# Patient Record
Sex: Female | Born: 1960 | Race: Black or African American | Hispanic: No | Marital: Single | Smoking: Current every day smoker
Health system: Southern US, Community
[De-identification: ages and names within clinical notes are randomized; demographics above are authoritative.]

## PROBLEM LIST (undated history)

## (undated) DIAGNOSIS — I10 Essential (primary) hypertension: Secondary | ICD-10-CM

## (undated) DIAGNOSIS — E119 Type 2 diabetes mellitus without complications: Secondary | ICD-10-CM

## (undated) DIAGNOSIS — E7800 Pure hypercholesterolemia, unspecified: Secondary | ICD-10-CM

---

## 2010-10-13 ENCOUNTER — Other Ambulatory Visit (HOSPITAL_COMMUNITY): Payer: Self-pay | Admitting: Family Medicine

## 2010-10-13 DIAGNOSIS — R102 Pelvic and perineal pain: Secondary | ICD-10-CM

## 2010-10-16 ENCOUNTER — Ambulatory Visit (HOSPITAL_COMMUNITY)
Admission: RE | Admit: 2010-10-16 | Discharge: 2010-10-16 | Disposition: A | Discharge status: Home / Self Care | Payer: Self-pay | Source: Ambulatory Visit | Attending: Family Medicine | Admitting: Family Medicine

## 2010-10-16 ENCOUNTER — Other Ambulatory Visit (HOSPITAL_COMMUNITY): Payer: Self-pay

## 2010-10-16 DIAGNOSIS — N83209 Unspecified ovarian cyst, unspecified side: Secondary | ICD-10-CM | POA: Insufficient documentation

## 2010-10-16 DIAGNOSIS — N949 Unspecified condition associated with female genital organs and menstrual cycle: Secondary | ICD-10-CM | POA: Insufficient documentation

## 2010-10-16 DIAGNOSIS — D259 Leiomyoma of uterus, unspecified: Secondary | ICD-10-CM | POA: Insufficient documentation

## 2010-10-16 DIAGNOSIS — R102 Pelvic and perineal pain: Secondary | ICD-10-CM

## 2014-02-19 ENCOUNTER — Telehealth: Payer: Self-pay

## 2014-02-19 NOTE — Telephone Encounter (Signed)
Tried to call pt to update meds, and Vm not set up.

## 2014-03-10 NOTE — Telephone Encounter (Signed)
Error Wrong chart

## 2020-10-17 ENCOUNTER — Encounter (HOSPITAL_COMMUNITY): Payer: Self-pay

## 2020-10-17 ENCOUNTER — Emergency Department (HOSPITAL_COMMUNITY): Payer: Self-pay

## 2020-10-17 ENCOUNTER — Emergency Department (HOSPITAL_COMMUNITY)
Admission: EM | Admit: 2020-10-17 | Discharge: 2020-10-17 | Disposition: A | Discharge status: Home / Self Care | Payer: Self-pay | Attending: Emergency Medicine | Admitting: Emergency Medicine

## 2020-10-17 ENCOUNTER — Other Ambulatory Visit: Payer: Self-pay

## 2020-10-17 DIAGNOSIS — Z8616 Personal history of COVID-19: Secondary | ICD-10-CM | POA: Insufficient documentation

## 2020-10-17 DIAGNOSIS — I10 Essential (primary) hypertension: Secondary | ICD-10-CM | POA: Insufficient documentation

## 2020-10-17 DIAGNOSIS — E119 Type 2 diabetes mellitus without complications: Secondary | ICD-10-CM | POA: Insufficient documentation

## 2020-10-17 DIAGNOSIS — J029 Acute pharyngitis, unspecified: Secondary | ICD-10-CM | POA: Insufficient documentation

## 2020-10-17 DIAGNOSIS — R1013 Epigastric pain: Secondary | ICD-10-CM | POA: Insufficient documentation

## 2020-10-17 DIAGNOSIS — F17210 Nicotine dependence, cigarettes, uncomplicated: Secondary | ICD-10-CM | POA: Insufficient documentation

## 2020-10-17 DIAGNOSIS — R110 Nausea: Secondary | ICD-10-CM | POA: Insufficient documentation

## 2020-10-17 HISTORY — DX: Pure hypercholesterolemia, unspecified: E78.00

## 2020-10-17 HISTORY — DX: Type 2 diabetes mellitus without complications: E11.9

## 2020-10-17 HISTORY — DX: Essential (primary) hypertension: I10

## 2020-10-17 LAB — COMPREHENSIVE METABOLIC PANEL
ALT: 15 U/L (ref 0–44)
AST: 21 U/L (ref 15–41)
Albumin: 4.5 g/dL (ref 3.5–5.0)
Alkaline Phosphatase: 59 U/L (ref 38–126)
Anion gap: 8 (ref 5–15)
BUN: 19 mg/dL (ref 6–20)
CO2: 25 mmol/L (ref 22–32)
Calcium: 9.7 mg/dL (ref 8.9–10.3)
Chloride: 104 mmol/L (ref 98–111)
Creatinine, Ser: 0.81 mg/dL (ref 0.44–1.00)
GFR, Estimated: >60 mL/min (ref >60)
Glucose, Bld: 101 mg/dL — ABNORMAL HIGH (ref 70–99)
Potassium: 4 mmol/L (ref 3.5–5.1)
Sodium: 137 mmol/L (ref 135–145)
Total Bilirubin: 0.6 mg/dL (ref 0.3–1.2)
Total Protein: 7.5 g/dL (ref 6.5–8.1)

## 2020-10-17 LAB — CBC
HCT: 46.8 % — ABNORMAL HIGH (ref 36.0–46.0)
Hemoglobin: 15.2 g/dL — ABNORMAL HIGH (ref 12.0–15.0)
MCH: 32.7 pg (ref 26.0–34.0)
MCHC: 32.5 g/dL (ref 30.0–36.0)
MCV: 100.6 fL — ABNORMAL HIGH (ref 80.0–100.0)
Platelets: 161 10*3/uL (ref 150–400)
RBC: 4.65 MIL/uL (ref 3.87–5.11)
RDW: 13.2 % (ref 11.5–15.5)
WBC: 6.2 10*3/uL (ref 4.0–10.5)
nRBC: 0 % (ref 0.0–0.2)

## 2020-10-17 LAB — URINALYSIS, ROUTINE W REFLEX MICROSCOPIC
Bacteria, UA: NONE SEEN
Bilirubin Urine: NEGATIVE
Glucose, UA: NEGATIVE mg/dL
Ketones, ur: 20 mg/dL — AB
Leukocytes,Ua: NEGATIVE
Nitrite: NEGATIVE
Protein, ur: NEGATIVE mg/dL
Specific Gravity, Urine: 1.012 (ref 1.005–1.030)
pH: 5 (ref 5.0–8.0)

## 2020-10-17 LAB — LIPASE, BLOOD: Lipase: 23 U/L (ref 11–51)

## 2020-10-17 LAB — TROPONIN I (HIGH SENSITIVITY)
Troponin I (High Sensitivity): 33 ng/L — ABNORMAL HIGH (ref <18)
Troponin I (High Sensitivity): 30 ng/L — ABNORMAL HIGH (ref <18)

## 2020-10-17 MED ORDER — ONDANSETRON HCL 4 MG/2ML IJ SOLN
4.0000 mg | Freq: Once | INTRAMUSCULAR | Status: DC
  Filled 2020-10-17: qty 2

## 2020-10-17 MED ORDER — OMEPRAZOLE 20 MG PO CPDR: 20.0000 mg | DELAYED_RELEASE_CAPSULE | Freq: Every day | ORAL | 1 refills | Status: AC

## 2020-10-17 MED ORDER — OMEPRAZOLE 20 MG PO CPDR: 20.0000 mg | DELAYED_RELEASE_CAPSULE | Freq: Every day | ORAL | 1 refills | Status: DC

## 2020-10-17 NOTE — ED Notes (Signed)
Advised pt I needed urine

## 2020-10-17 NOTE — ED Triage Notes (Signed)
Pt presents to ED with complaints of epigastric pain started last night but worse overnight, with nausea.

## 2020-10-17 NOTE — ED Provider Notes (Signed)
[facility] EMERGENCY DEPARTMENT Provider Note   CSN: [id number] Arrival date & time: 10/17/20  0756     History Chief Complaint  Patient presents with  . Abdominal Pain    Catherine Lindsey is a 60 y.o. female.  Patient with onset of epigastric abdominal pain last night got worse overnight.  Associated with nausea but no vomiting.  Is not on any medications.  Past medical history sniffing for hypertension high cholesterol diabetes without complications.  Symptoms also associated with sore throat.  Patient had COVID in February.  Does not remind her that.        Past Medical History:  Diagnosis Date  . Diabetes mellitus without complication ([place])   . Hypercholesteremia   . Hypertension     There are no problems to display for this patient.   Past Surgical History:  Procedure Laterality Date  . CESAREAN SECTION       OB History   No obstetric history on file.     No family history on file.  Social History   Tobacco Use  . Smoking status: Current Every Day Smoker    Packs/day: 0.50    Types: Cigarettes  . Smokeless tobacco: Never Used  Substance Use Topics  . Alcohol use: Yes    Comment: 1-2 beer/day  . Drug use: Not Currently    Home Medications Prior to Admission medications   Not on File    Allergies    Patient has no known allergies.  Review of Systems   Review of Systems  Constitutional: Negative for chills and fever.  HENT: Negative for rhinorrhea and sore throat.   Eyes: Negative for visual disturbance.  Respiratory: Negative for cough and shortness of breath.   Cardiovascular: Negative for chest pain and leg swelling.  Gastrointestinal: Positive for abdominal pain. Negative for diarrhea, nausea and vomiting.  Genitourinary: Negative for dysuria.  Musculoskeletal: Negative for back pain and neck pain.  Skin: Negative for rash.  Neurological: Negative for dizziness, light-headedness and headaches.  Hematological: Does not bruise/bleed  easily.  Psychiatric/Behavioral: Negative for confusion.    Physical Exam Updated Vital Signs BP (!) 156/88   Pulse 82   Temp 98 F (36.7 C) (Oral)   Resp 20   Ht 1.676 m (5\' 6" )   Wt 60.3 kg   SpO2 100%   BMI 21.47 kg/m   Physical Exam Vitals and nursing note reviewed.  Constitutional:      General: She is not in acute distress.    Appearance: Normal appearance. She is well-developed. She is not ill-appearing.  HENT:     Head: Normocephalic and atraumatic.     Mouth/Throat:     Pharynx: Oropharynx is clear.  Eyes:     Extraocular Movements: Extraocular movements intact.     Conjunctiva/sclera: Conjunctivae normal.     Pupils: Pupils are equal, round, and reactive to light.  Cardiovascular:     Rate and Rhythm: Normal rate and regular rhythm.     Heart sounds: No murmur heard.   Pulmonary:     Effort: Pulmonary effort is normal. No respiratory distress.     Breath sounds: Normal breath sounds. No wheezing.  Abdominal:     Palpations: Abdomen is soft.     Tenderness: There is no abdominal tenderness.  Musculoskeletal:        General: No swelling. Normal range of motion.     Cervical back: Normal range of motion and neck supple.  Skin:    General: Skin is  warm and dry.  Neurological:     General: No focal deficit present.     Mental Status: She is alert and oriented to person, place, and time.     Cranial Nerves: No cranial nerve deficit.     Sensory: No sensory deficit.     Motor: No weakness.     ED Results / Procedures / Treatments   Labs (all labs ordered are listed, but only abnormal results are displayed) Labs Reviewed  LIPASE, BLOOD  COMPREHENSIVE METABOLIC PANEL  CBC  URINALYSIS, ROUTINE W REFLEX MICROSCOPIC    EKG EKG Interpretation  Date/Time:  Monday Oct 17 2020 08:06:33 EDT Ventricular Rate:  85 PR Interval:  160 QRS Duration: 98 QT Interval:  397 QTC Calculation: 473 R Axis:   74 Text Interpretation: Sinus rhythm LAE, consider  biatrial enlargement LVH with secondary repolarization abnormality No previous ECGs available Confirmed by Fredia Sorrow [phone]) on 10/17/2020 8:14:23 AM   Radiology No results found.  Procedures Procedures   Medications Ordered in ED Medications - No data to display  ED Course  I have reviewed the triage vital signs and the nursing notes.  Pertinent labs & imaging results that were available during my care of the patient were reviewed by me and considered in my medical decision making (see chart for details).    MDM Rules/Calculators/A&P                          Work-up here ultrasound without any acute findings.  Initial troponin was 30 3 repeat was 30.  Urinalysis negative for urinary tract infection.  Liver function test without any acute findings.  No leukocytosis.  Hemoglobin 15 somewhat may be hemoconcentrated.  Chest x-ray without any acute findings EKG without any acute findings.   Clinically we will treat as if it could be reflux.  But not exactly sure how to explain the sore throat.  Less it was reflux up into the throat area.  Give patient referral to GI medicine treat with Prilosec.  Patient will return for any new or worse symptoms.  No evidence of any acute cardiac event.  No evidence of any acute pulmonary event.  Patient states still having some epigastric pain is intermittent in nature.  Final Clinical Impression(s) / ED Diagnoses Final diagnoses:  None    Rx / DC Orders ED Discharge Orders    None       Fredia Sorrow, MD 10/17/20 1404

## 2020-10-17 NOTE — Discharge Instructions (Addendum)
Work-up here today without any acute findings.  Make an appointment to follow-up with GI medicine information provided above.  Take the Prilosec as directed.  Return for any new or worse symptoms.

## 2020-10-17 NOTE — ED Notes (Signed)
Put the patient on a 12 lead monitor

## 2022-10-17 IMAGING — US US ABDOMEN COMPLETE
3 of 4 series · 13 of 25 positions shown · non-contrast
Comparison: None.

CLINICAL DATA: Epigastric pain for 1 day

EXAM:
ABDOMEN ULTRASOUND COMPLETE

[Series 1: us abdomen complete · 6 of 114 slices shown (1 of 2)]
[im 1/114]
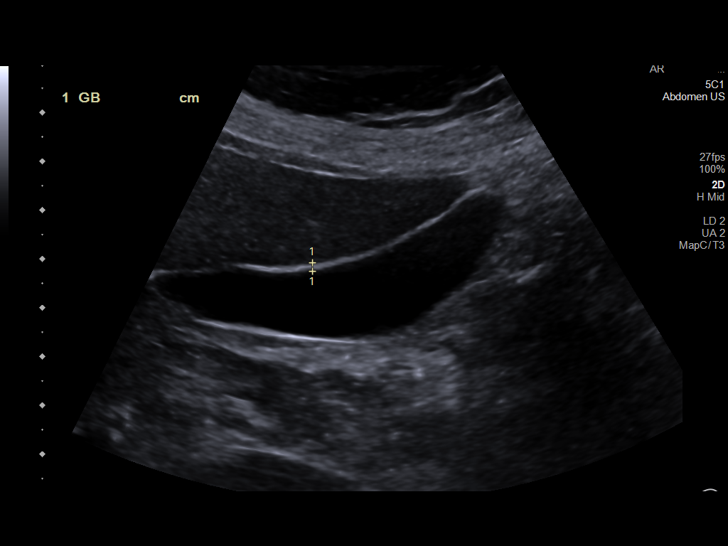
[im 23/114]
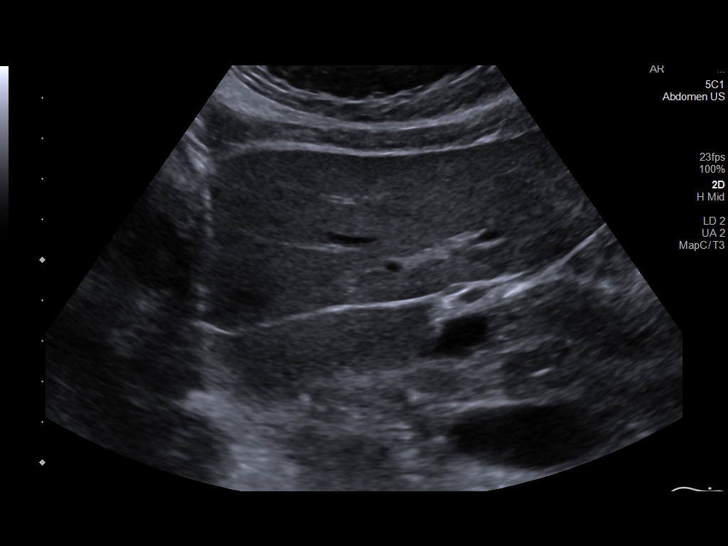
[im 46/114]
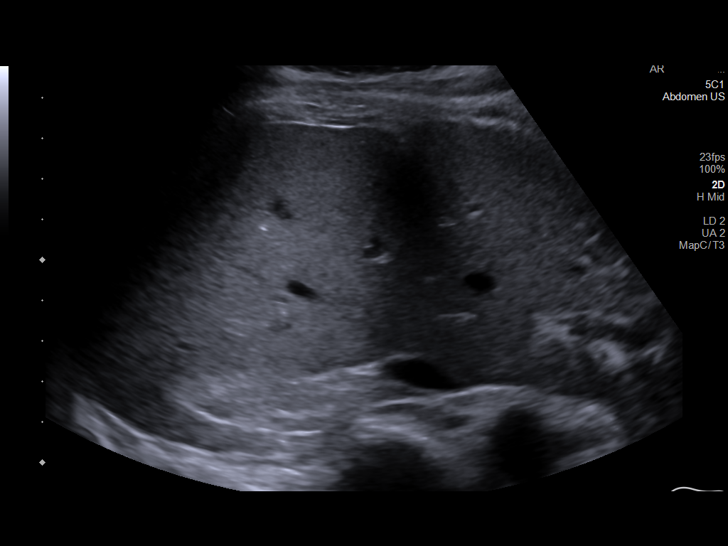
[im 68/114]
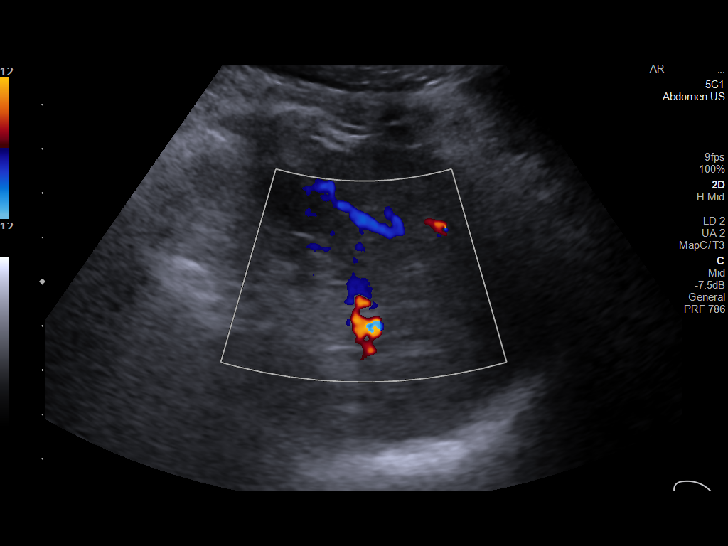
[im 91/114]
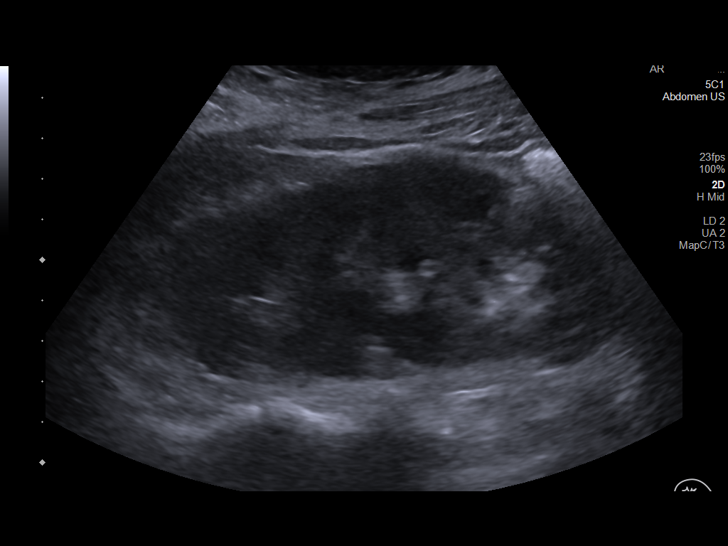
[im 114/114]
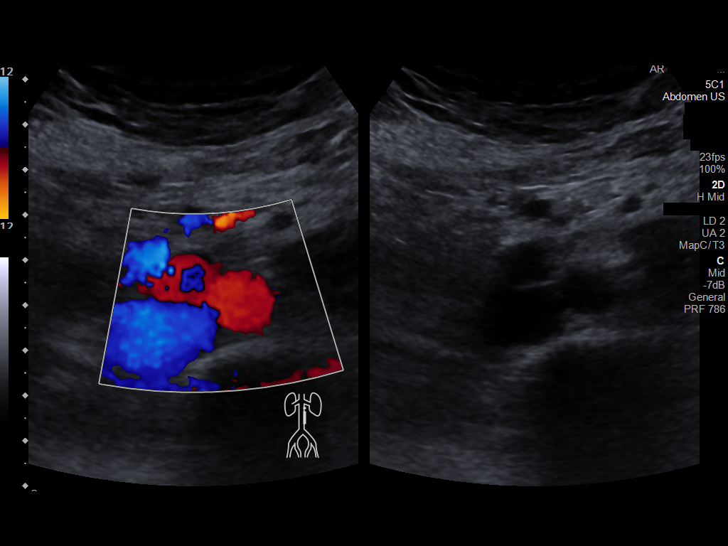

[Series 1: us abdomen complete · 6 of 117 slices shown (2 of 2)]
[im 11/117]
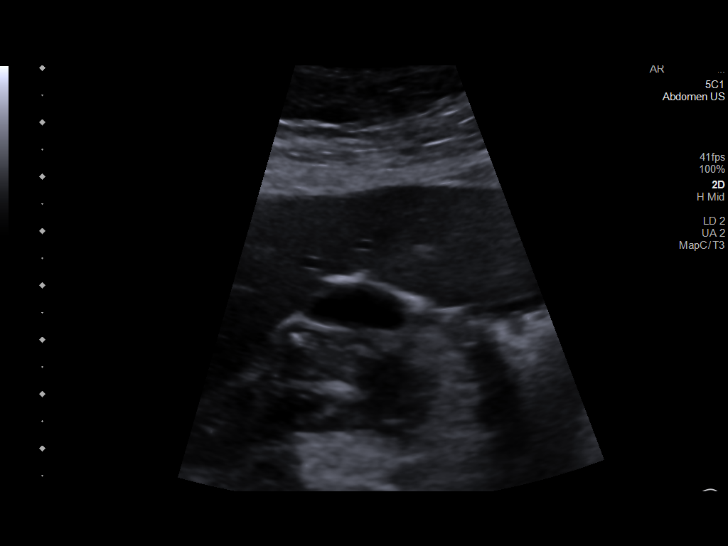
[im 32/117]
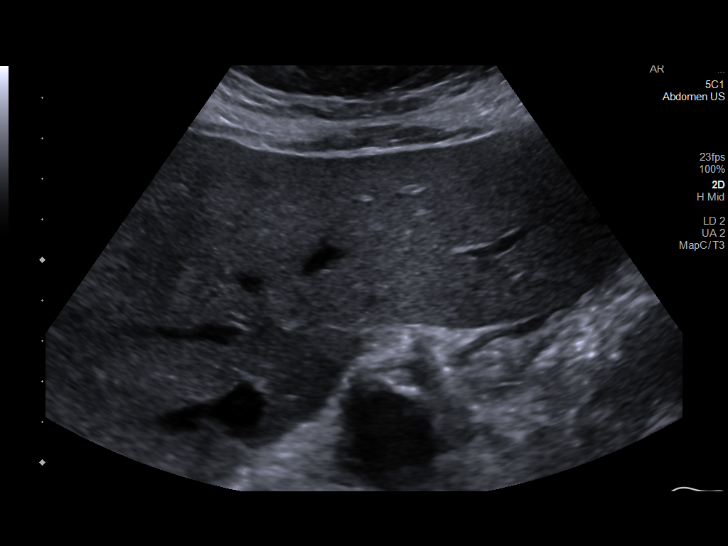
[im 53/117]
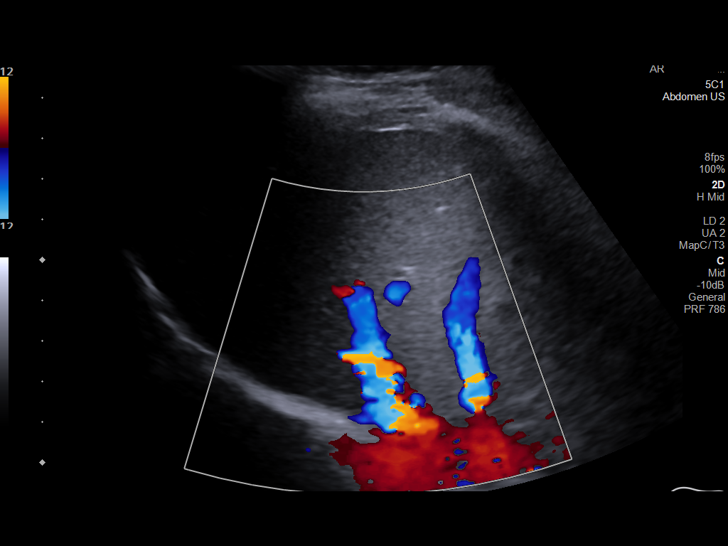
[im 74/117]
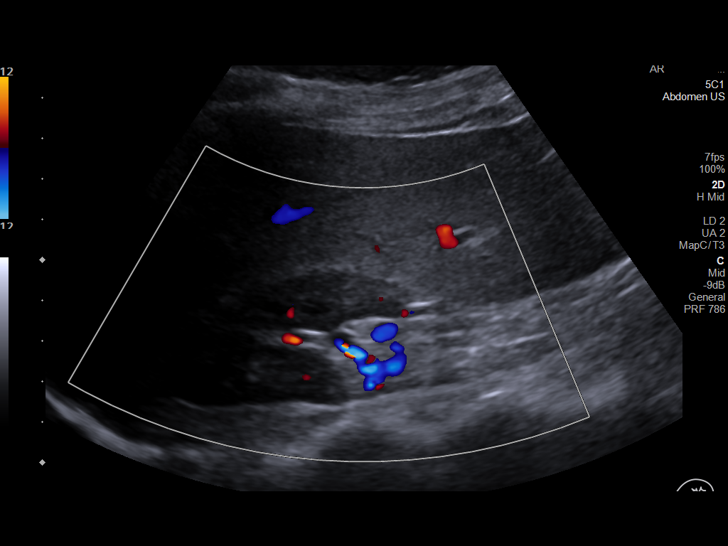
[im 95/117]
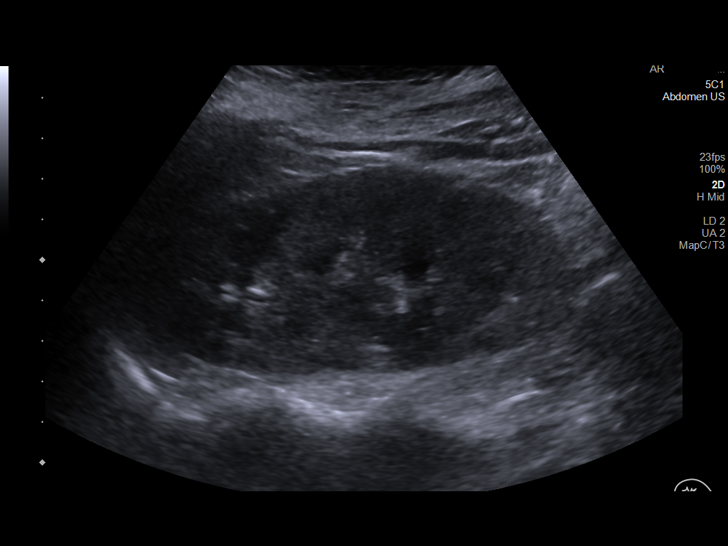
[im 117/117]
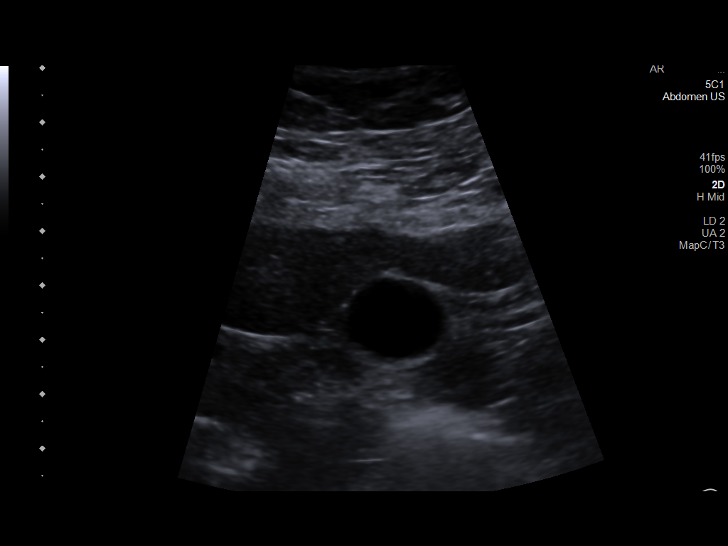

[Series 1002: abdomen us · 1 of 1 slices shown]
[im 1/1]
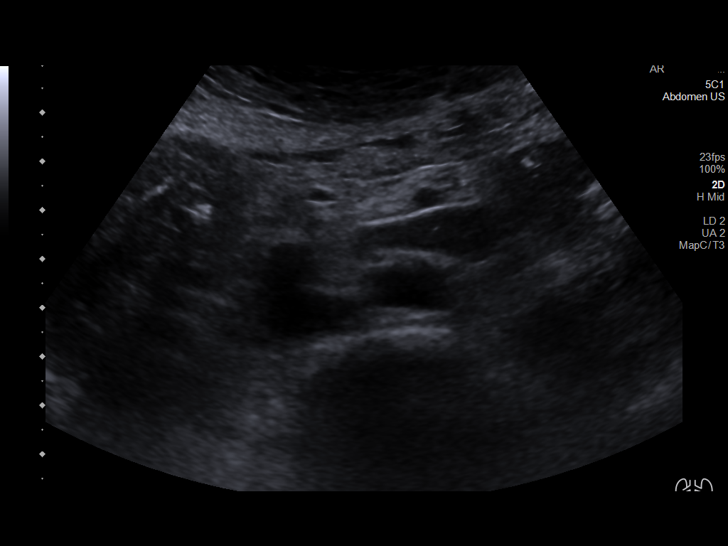

[13 of 25 positions shown; findings below may reference images not displayed]

FINDINGS: Gallbladder: No gallstones or wall thickening visualized. No
sonographic Murphy sign noted by sonographer.

Common bile duct: Diameter: 3 mm

Liver: No focal lesion identified. Within normal limits in
parenchymal echogenicity. Portal vein is patent on color Doppler
imaging with normal direction of blood flow towards the liver.

IVC: No abnormality visualized.

Pancreas: Visualized portion unremarkable.

Spleen: Size and appearance within normal limits.

Right Kidney: Length: 9.8 cm. Echogenicity within normal limits. No
mass or hydronephrosis visualized.

Left Kidney: Length: 11.9 cm. Echogenicity within normal limits. No
mass or hydronephrosis visualized.

Abdominal aorta: No aneurysm visualized.

Other findings: None.
IMPRESSION: No acute abnormality noted.
